# Patient Record
Sex: Male | Born: 1965 | Race: White | Hispanic: No | Marital: Married
Health system: Southern US, Community
[De-identification: ages and names within clinical notes are randomized; demographics above are authoritative.]

## PROBLEM LIST (undated history)

## (undated) DIAGNOSIS — S069XAA Unspecified intracranial injury with loss of consciousness status unknown, initial encounter: Secondary | ICD-10-CM

## (undated) DIAGNOSIS — S069X9A Unspecified intracranial injury with loss of consciousness of unspecified duration, initial encounter: Secondary | ICD-10-CM

## (undated) HISTORY — PX: TRACHEOSTOMY: SUR1362

---

## 2015-09-10 ENCOUNTER — Emergency Department (HOSPITAL_COMMUNITY): Payer: Medicare Other

## 2015-09-10 ENCOUNTER — Inpatient Hospital Stay (HOSPITAL_COMMUNITY)
Admission: EM | Admit: 2015-09-10 | Discharge: 2015-09-16 | DRG: 207 | Disposition: A | Discharge status: Skilled Nursing Facility | Payer: Medicare Other | Attending: Internal Medicine | Admitting: Internal Medicine

## 2015-09-10 ENCOUNTER — Encounter (HOSPITAL_COMMUNITY): Payer: Self-pay | Admitting: Emergency Medicine

## 2015-09-10 DIAGNOSIS — E119 Type 2 diabetes mellitus without complications: Secondary | ICD-10-CM

## 2015-09-10 DIAGNOSIS — D649 Anemia, unspecified: Secondary | ICD-10-CM | POA: Diagnosis present

## 2015-09-10 DIAGNOSIS — E871 Hypo-osmolality and hyponatremia: Secondary | ICD-10-CM | POA: Diagnosis not present

## 2015-09-10 DIAGNOSIS — F32A Depression, unspecified: Secondary | ICD-10-CM | POA: Diagnosis present

## 2015-09-10 DIAGNOSIS — E876 Hypokalemia: Secondary | ICD-10-CM | POA: Diagnosis present

## 2015-09-10 DIAGNOSIS — E785 Hyperlipidemia, unspecified: Secondary | ICD-10-CM | POA: Diagnosis present

## 2015-09-10 DIAGNOSIS — R109 Unspecified abdominal pain: Secondary | ICD-10-CM

## 2015-09-10 DIAGNOSIS — Z9911 Dependence on respirator [ventilator] status: Secondary | ICD-10-CM

## 2015-09-10 DIAGNOSIS — T17990A Other foreign object in respiratory tract, part unspecified in causing asphyxiation, initial encounter: Secondary | ICD-10-CM | POA: Diagnosis present

## 2015-09-10 DIAGNOSIS — R402422 Glasgow coma scale score 9-12, at arrival to emergency department: Secondary | ICD-10-CM | POA: Diagnosis not present

## 2015-09-10 DIAGNOSIS — Y95 Nosocomial condition: Secondary | ICD-10-CM | POA: Diagnosis not present

## 2015-09-10 DIAGNOSIS — E43 Unspecified severe protein-calorie malnutrition: Secondary | ICD-10-CM | POA: Diagnosis present

## 2015-09-10 DIAGNOSIS — R0902 Hypoxemia: Secondary | ICD-10-CM

## 2015-09-10 DIAGNOSIS — R0689 Other abnormalities of breathing: Secondary | ICD-10-CM

## 2015-09-10 DIAGNOSIS — Z931 Gastrostomy status: Secondary | ICD-10-CM

## 2015-09-10 DIAGNOSIS — R64 Cachexia: Secondary | ICD-10-CM | POA: Diagnosis not present

## 2015-09-10 DIAGNOSIS — G40909 Epilepsy, unspecified, not intractable, without status epilepticus: Secondary | ICD-10-CM | POA: Diagnosis not present

## 2015-09-10 DIAGNOSIS — D473 Essential (hemorrhagic) thrombocythemia: Secondary | ICD-10-CM | POA: Diagnosis present

## 2015-09-10 DIAGNOSIS — F329 Major depressive disorder, single episode, unspecified: Secondary | ICD-10-CM | POA: Diagnosis not present

## 2015-09-10 DIAGNOSIS — J9621 Acute and chronic respiratory failure with hypoxia: Secondary | ICD-10-CM | POA: Diagnosis present

## 2015-09-10 DIAGNOSIS — D7589 Other specified diseases of blood and blood-forming organs: Secondary | ICD-10-CM | POA: Diagnosis not present

## 2015-09-10 DIAGNOSIS — Z87820 Personal history of traumatic brain injury: Secondary | ICD-10-CM

## 2015-09-10 DIAGNOSIS — Z23 Encounter for immunization: Secondary | ICD-10-CM

## 2015-09-10 DIAGNOSIS — K9423 Gastrostomy malfunction: Secondary | ICD-10-CM

## 2015-09-10 DIAGNOSIS — Z794 Long term (current) use of insulin: Secondary | ICD-10-CM

## 2015-09-10 DIAGNOSIS — E1165 Type 2 diabetes mellitus with hyperglycemia: Secondary | ICD-10-CM | POA: Diagnosis present

## 2015-09-10 DIAGNOSIS — G894 Chronic pain syndrome: Secondary | ICD-10-CM | POA: Diagnosis present

## 2015-09-10 DIAGNOSIS — L89153 Pressure ulcer of sacral region, stage 3: Secondary | ICD-10-CM | POA: Diagnosis present

## 2015-09-10 DIAGNOSIS — Z79899 Other long term (current) drug therapy: Secondary | ICD-10-CM | POA: Diagnosis not present

## 2015-09-10 DIAGNOSIS — S069XAA Unspecified intracranial injury with loss of consciousness status unknown, initial encounter: Secondary | ICD-10-CM | POA: Diagnosis present

## 2015-09-10 DIAGNOSIS — I10 Essential (primary) hypertension: Secondary | ICD-10-CM | POA: Diagnosis not present

## 2015-09-10 DIAGNOSIS — Y838 Other surgical procedures as the cause of abnormal reaction of the patient, or of later complication, without mention of misadventure at the time of the procedure: Secondary | ICD-10-CM | POA: Diagnosis not present

## 2015-09-10 DIAGNOSIS — S069X9D Unspecified intracranial injury with loss of consciousness of unspecified duration, subsequent encounter: Secondary | ICD-10-CM

## 2015-09-10 DIAGNOSIS — J189 Pneumonia, unspecified organism: Secondary | ICD-10-CM | POA: Diagnosis present

## 2015-09-10 DIAGNOSIS — D75839 Thrombocytosis, unspecified: Secondary | ICD-10-CM | POA: Diagnosis present

## 2015-09-10 DIAGNOSIS — Z681 Body mass index (BMI) 19 or less, adult: Secondary | ICD-10-CM | POA: Diagnosis not present

## 2015-09-10 DIAGNOSIS — J151 Pneumonia due to Pseudomonas: Principal | ICD-10-CM | POA: Diagnosis present

## 2015-09-10 DIAGNOSIS — Z930 Tracheostomy status: Secondary | ICD-10-CM | POA: Diagnosis not present

## 2015-09-10 DIAGNOSIS — R0603 Acute respiratory distress: Secondary | ICD-10-CM

## 2015-09-10 DIAGNOSIS — S069X9A Unspecified intracranial injury with loss of consciousness of unspecified duration, initial encounter: Secondary | ICD-10-CM | POA: Diagnosis present

## 2015-09-10 DIAGNOSIS — D72829 Elevated white blood cell count, unspecified: Secondary | ICD-10-CM

## 2015-09-10 DIAGNOSIS — J9620 Acute and chronic respiratory failure, unspecified whether with hypoxia or hypercapnia: Secondary | ICD-10-CM | POA: Diagnosis present

## 2015-09-10 HISTORY — DX: Unspecified intracranial injury with loss of consciousness of unspecified duration, initial encounter: S06.9X9A

## 2015-09-10 HISTORY — DX: Unspecified intracranial injury with loss of consciousness status unknown, initial encounter: S06.9XAA

## 2015-09-10 LAB — I-STAT CG4 LACTIC ACID, ED
LACTIC ACID, VENOUS: 2.13 mmol/L — AB (ref 0.5–2.0)
LACTIC ACID, VENOUS: 0.72 mmol/L (ref 0.5–2.0)

## 2015-09-10 LAB — CBC WITH DIFFERENTIAL/PLATELET
Basophils Absolute: 0.1 10*3/uL (ref 0.0–0.1)
Basophils Relative: 0 %
EOS PCT: 2 %
Eosinophils Absolute: 0.4 10*3/uL (ref 0.0–0.7)
HCT: 27.4 % — ABNORMAL LOW (ref 39.0–52.0)
Hemoglobin: 8.4 g/dL — ABNORMAL LOW (ref 13.0–17.0)
LYMPHS ABS: 1.4 10*3/uL (ref 0.7–4.0)
Lymphocytes Relative: 6 %
MCH: 27.8 pg (ref 26.0–34.0)
MCHC: 30.7 g/dL (ref 30.0–36.0)
MCV: 90.7 fL (ref 78.0–100.0)
MONO ABS: 2 10*3/uL — AB (ref 0.1–1.0)
MONOS PCT: 8 %
Neutro Abs: 19.6 10*3/uL — ABNORMAL HIGH (ref 1.7–7.7)
Neutrophils Relative %: 84 %
PLATELETS: 843 10*3/uL — AB (ref 150–400)
RBC: 3.02 MIL/uL — ABNORMAL LOW (ref 4.22–5.81)
RDW: 14.5 % (ref 11.5–15.5)
WBC: 23.3 10*3/uL — ABNORMAL HIGH (ref 4.0–10.5)

## 2015-09-10 LAB — URINALYSIS, ROUTINE W REFLEX MICROSCOPIC
Bilirubin Urine: NEGATIVE
Glucose, UA: NEGATIVE mg/dL
KETONES UR: NEGATIVE mg/dL
NITRITE: NEGATIVE
PH: 5.5 (ref 5.0–8.0)
Protein, ur: 30 mg/dL — AB
Specific Gravity, Urine: 1.02 (ref 1.005–1.030)

## 2015-09-10 LAB — URINE MICROSCOPIC-ADD ON

## 2015-09-10 LAB — COMPREHENSIVE METABOLIC PANEL
ALBUMIN: 2.9 g/dL — AB (ref 3.5–5.0)
ALT: 49 U/L (ref 17–63)
AST: 25 U/L (ref 15–41)
Alkaline Phosphatase: 116 U/L (ref 38–126)
Anion gap: 10 (ref 5–15)
BILIRUBIN TOTAL: 0.2 mg/dL — AB (ref 0.3–1.2)
BUN: 8 mg/dL (ref 6–20)
CHLORIDE: 85 mmol/L — AB (ref 101–111)
CO2: 32 mmol/L (ref 22–32)
Calcium: 8.9 mg/dL (ref 8.9–10.3)
Creatinine, Ser: 0.33 mg/dL — ABNORMAL LOW (ref 0.61–1.24)
GFR calc Af Amer: >60 mL/min (ref >60)
GFR calc non Af Amer: >60 mL/min (ref >60)
GLUCOSE: 161 mg/dL — AB (ref 65–99)
POTASSIUM: 3.7 mmol/L (ref 3.5–5.1)
Sodium: 127 mmol/L — ABNORMAL LOW (ref 135–145)
Total Protein: 8.3 g/dL — ABNORMAL HIGH (ref 6.5–8.1)

## 2015-09-10 LAB — I-STAT ARTERIAL BLOOD GAS, ED
ACID-BASE EXCESS: 11 mmol/L — AB (ref 0.0–2.0)
BICARBONATE: 38.6 meq/L — AB (ref 20.0–24.0)
O2 Saturation: 100 %
TCO2: 41 mmol/L (ref 0–100)
pCO2 arterial: 70.8 mmHg (ref 35.0–45.0)
pH, Arterial: 7.345 — ABNORMAL LOW (ref 7.350–7.450)
pO2, Arterial: 467 mmHg — ABNORMAL HIGH (ref 80.0–100.0)

## 2015-09-10 LAB — I-STAT TROPONIN, ED: Troponin i, poc: 0 ng/mL (ref 0.00–0.08)

## 2015-09-10 MED ORDER — SERTRALINE HCL 50 MG PO TABS
150.0000 mg | ORAL_TABLET | Freq: Every day | ORAL | Status: DC
  Administered 2015-09-11 – 2015-09-16 (×6): 150 mg
  Filled 2015-09-10 (×2): qty 1
  Filled 2015-09-10: qty 3
  Filled 2015-09-10 (×4): qty 1

## 2015-09-10 MED ORDER — ACETAMINOPHEN 325 MG PO TABS
650.0000 mg | ORAL_TABLET | Freq: Four times a day (QID) | ORAL | Status: DC | PRN
  Administered 2015-09-12 – 2015-09-14 (×2): 650 mg
  Filled 2015-09-10 (×2): qty 2

## 2015-09-10 MED ORDER — ONDANSETRON HCL 4 MG PO TABS
4.0000 mg | ORAL_TABLET | Freq: Four times a day (QID) | ORAL | Status: DC | PRN
Start: 2015-09-10 — End: 2015-09-16

## 2015-09-10 MED ORDER — DIPHENHYDRAMINE HCL 12.5 MG/5ML PO LIQD
25.0000 mg | Freq: Four times a day (QID) | ORAL | Status: DC | PRN
  Administered 2015-09-13: 25 mg
  Filled 2015-09-10: qty 10

## 2015-09-10 MED ORDER — MORPHINE SULFATE (PF) 4 MG/ML IV SOLN
4.0000 mg | INTRAVENOUS | Status: AC | PRN
  Administered 2015-09-10 – 2015-09-11 (×4): 4 mg via INTRAVENOUS
  Filled 2015-09-10 (×4): qty 1

## 2015-09-10 MED ORDER — VITAL 1.5 CAL PO LIQD
42.0000 mL/h | ORAL | Status: DC
  Administered 2015-09-11 (×2): 42 mL/h
  Filled 2015-09-10 (×2): qty 1000

## 2015-09-10 MED ORDER — ENOXAPARIN SODIUM 40 MG/0.4ML ~~LOC~~ SOLN
40.0000 mg | SUBCUTANEOUS | Status: DC
  Administered 2015-09-11: 40 mg via SUBCUTANEOUS
  Filled 2015-09-10: qty 0.4

## 2015-09-10 MED ORDER — DIVALPROEX SODIUM 500 MG PO DR TAB
500.0000 mg | DELAYED_RELEASE_TABLET | Freq: Every day | ORAL | Status: DC
  Administered 2015-09-10 – 2015-09-15 (×6): 500 mg via ORAL
  Filled 2015-09-10: qty 2
  Filled 2015-09-10 (×5): qty 1

## 2015-09-10 MED ORDER — PIPERACILLIN-TAZOBACTAM 3.375 G IVPB 30 MIN
3.3750 g | Freq: Once | INTRAVENOUS | Status: AC
Start: 2015-09-10 — End: 2015-09-10
  Administered 2015-09-10: 3.375 g via INTRAVENOUS
  Filled 2015-09-10: qty 50

## 2015-09-10 MED ORDER — ALBUTEROL SULFATE (2.5 MG/3ML) 0.083% IN NEBU: 2.5000 mg | INHALATION_SOLUTION | RESPIRATORY_TRACT | Status: DC | PRN

## 2015-09-10 MED ORDER — DIVALPROEX SODIUM 250 MG PO DR TAB
250.0000 mg | DELAYED_RELEASE_TABLET | Freq: Two times a day (BID) | ORAL | Status: DC
  Administered 2015-09-11 – 2015-09-16 (×11): 250 mg via ORAL
  Filled 2015-09-10 (×13): qty 1

## 2015-09-10 MED ORDER — SENNOSIDES-DOCUSATE SODIUM 8.6-50 MG PO TABS
1.0000 | ORAL_TABLET | Freq: Two times a day (BID) | ORAL | Status: DC
  Administered 2015-09-10 – 2015-09-16 (×12): 1
  Filled 2015-09-10 (×13): qty 1

## 2015-09-10 MED ORDER — IPRATROPIUM-ALBUTEROL 0.5-2.5 (3) MG/3ML IN SOLN
3.0000 mL | RESPIRATORY_TRACT | Status: DC | PRN
  Filled 2015-09-10: qty 3

## 2015-09-10 MED ORDER — SODIUM CHLORIDE 0.9 % IV BOLUS (SEPSIS)
1000.0000 mL | Freq: Once | INTRAVENOUS | Status: AC
  Administered 2015-09-10: 1000 mL via INTRAVENOUS

## 2015-09-10 MED ORDER — BACITRACIN ZINC 500 UNIT/GM EX OINT
1.0000 "application " | TOPICAL_OINTMENT | Freq: Two times a day (BID) | CUTANEOUS | Status: DC
  Administered 2015-09-11 – 2015-09-13 (×6): 1 via TOPICAL
  Filled 2015-09-10: qty 28.35

## 2015-09-10 MED ORDER — MAGNESIUM OXIDE 400 (241.3 MG) MG PO TABS: 400.0000 mg | ORAL_TABLET | Freq: Two times a day (BID) | ORAL | Status: DC | PRN

## 2015-09-10 MED ORDER — CLOTRIMAZOLE 1 % EX CREA
TOPICAL_CREAM | Freq: Two times a day (BID) | CUTANEOUS | Status: DC
  Administered 2015-09-11 – 2015-09-13 (×6): via TOPICAL
  Filled 2015-09-10: qty 15

## 2015-09-10 MED ORDER — IPRATROPIUM-ALBUTEROL 0.5-2.5 (3) MG/3ML IN SOLN
3.0000 mL | Freq: Four times a day (QID) | RESPIRATORY_TRACT | Status: DC
  Administered 2015-09-10 – 2015-09-14 (×14): 3 mL via RESPIRATORY_TRACT
  Filled 2015-09-10 (×14): qty 3

## 2015-09-10 MED ORDER — FAMOTIDINE 20 MG PO TABS
20.0000 mg | ORAL_TABLET | Freq: Two times a day (BID) | ORAL | Status: DC
  Administered 2015-09-10 – 2015-09-16 (×12): 20 mg via ORAL
  Filled 2015-09-10 (×12): qty 1

## 2015-09-10 MED ORDER — VANCOMYCIN HCL IN DEXTROSE 750-5 MG/150ML-% IV SOLN
750.0000 mg | INTRAVENOUS | Status: DC
  Administered 2015-09-11: 750 mg via INTRAVENOUS
  Filled 2015-09-10 (×2): qty 150

## 2015-09-10 MED ORDER — POLYETHYLENE GLYCOL 3350 17 G PO PACK: 17.0000 g | PACK | Freq: Every day | ORAL | Status: DC | PRN

## 2015-09-10 MED ORDER — SODIUM CHLORIDE 0.9 % IV BOLUS (SEPSIS)
1000.0000 mL | Freq: Once | INTRAVENOUS | Status: AC
Start: 2015-09-10 — End: 2015-09-10
  Administered 2015-09-10: 1000 mL via INTRAVENOUS

## 2015-09-10 MED ORDER — ONDANSETRON HCL 4 MG/2ML IJ SOLN
4.0000 mg | Freq: Once | INTRAMUSCULAR | Status: AC
  Administered 2015-09-10: 4 mg via INTRAVENOUS
  Filled 2015-09-10: qty 2

## 2015-09-10 MED ORDER — DIAZEPAM 5 MG PO TABS
10.0000 mg | ORAL_TABLET | Freq: Two times a day (BID) | ORAL | Status: DC
  Administered 2015-09-10 – 2015-09-16 (×12): 10 mg
  Filled 2015-09-10 (×12): qty 2

## 2015-09-10 MED ORDER — INSULIN ASPART 100 UNIT/ML ~~LOC~~ SOLN
0.0000 [IU] | Freq: Three times a day (TID) | SUBCUTANEOUS | Status: DC
Start: 2015-09-11 — End: 2015-09-11
  Administered 2015-09-11: 2 [IU] via SUBCUTANEOUS

## 2015-09-10 MED ORDER — THIAMINE HCL 100 MG PO TABS
200.0000 mg | ORAL_TABLET | Freq: Every day | ORAL | Status: DC
  Administered 2015-09-11 – 2015-09-14 (×4): 200 mg
  Filled 2015-09-10 (×3): qty 2

## 2015-09-10 MED ORDER — VANCOMYCIN HCL IN DEXTROSE 1-5 GM/200ML-% IV SOLN
1000.0000 mg | Freq: Once | INTRAVENOUS | Status: AC
  Administered 2015-09-10: 1000 mg via INTRAVENOUS
  Filled 2015-09-10: qty 200

## 2015-09-10 MED ORDER — PIPERACILLIN-TAZOBACTAM 3.375 G IVPB
3.3750 g | Freq: Three times a day (TID) | INTRAVENOUS | Status: DC
  Administered 2015-09-11 – 2015-09-14 (×11): 3.375 g via INTRAVENOUS
  Filled 2015-09-10 (×13): qty 50

## 2015-09-10 MED ORDER — QUETIAPINE FUMARATE 50 MG PO TABS
50.0000 mg | ORAL_TABLET | Freq: Three times a day (TID) | ORAL | Status: DC
  Administered 2015-09-11 – 2015-09-16 (×16): 50 mg via ORAL
  Filled 2015-09-10 (×16): qty 1

## 2015-09-10 MED ORDER — POTASSIUM CHLORIDE IN NACL 20-0.9 MEQ/L-% IV SOLN
INTRAVENOUS | Status: DC
  Administered 2015-09-10 – 2015-09-11 (×2): via INTRAVENOUS
  Filled 2015-09-10 (×3): qty 1000

## 2015-09-10 MED ORDER — CHLORHEXIDINE GLUCONATE 0.12 % MT SOLN
15.0000 mL | Freq: Four times a day (QID) | OROMUCOSAL | Status: DC
  Administered 2015-09-11 – 2015-09-16 (×20): 15 mL via OROMUCOSAL
  Filled 2015-09-10 (×8): qty 15

## 2015-09-10 MED ORDER — ONDANSETRON HCL 4 MG/2ML IJ SOLN
4.0000 mg | Freq: Four times a day (QID) | INTRAMUSCULAR | Status: DC | PRN
  Administered 2015-09-12: 4 mg via INTRAVENOUS
  Filled 2015-09-10: qty 2

## 2015-09-10 MED ORDER — FOLIC ACID 1 MG PO TABS
1.0000 mg | ORAL_TABLET | Freq: Every day | ORAL | Status: DC
  Administered 2015-09-11 – 2015-09-16 (×6): 1 mg via ORAL
  Filled 2015-09-10 (×6): qty 1

## 2015-09-10 MED ORDER — METOPROLOL TARTRATE 25 MG PO TABS
25.0000 mg | ORAL_TABLET | Freq: Two times a day (BID) | ORAL | Status: DC
  Administered 2015-09-10 – 2015-09-16 (×12): 25 mg via ORAL
  Filled 2015-09-10 (×12): qty 1

## 2015-09-10 MED ORDER — MORPHINE SULFATE 15 MG PO TABS
15.0000 mg | ORAL_TABLET | Freq: Four times a day (QID) | ORAL | Status: DC | PRN
  Administered 2015-09-11 – 2015-09-12 (×3): 15 mg via ORAL
  Filled 2015-09-10 (×4): qty 1

## 2015-09-10 NOTE — Progress Notes (Signed)
Pharmacy Antibiotic Note  Dean Jones is a 50 y.o. male admitted on 09/10/2015 with tachypnea, tachycardia, hypertensive during transport from [facility] to another facility. Pt has hx of TBI, is bedbound and has a chronic trach. LA 2.1, SCr 0.33, LFTs/Tbili wnl, WBC 23.3. Antibiotics are being administered empirically for sepsis.  Plan: -Vancomycin 1 g IV x1 then 750 mg IV q24h -Zosyn 3.375 g IV q8h -Monitor renal fx, cultures, VT at Css and duration of therapy   Height: 5\' 7"  (170.2 cm) Weight: 112 lb (50.803 kg) IBW/kg (Calculated) : 66.1  Temp (24hrs), Avg:98.7 F (37.1 C), Min:98.7 F (37.1 C), Max:98.7 F (37.1 C)   Recent Labs Lab 09/10/15 1725 09/10/15 1920  WBC 23.3*  --   CREATININE 0.33*  --   LATICACIDVEN  --  2.13*    Estimated Creatinine Clearance: 77.6 mL/min (by C-G formula based on Cr of 0.33).    Allergies  Allergen Reactions  . Codeine Shortness Of Breath, Swelling and Rash  . Haloperidol Other (See Comments)    MENTAL STATUS CHANGES/AGITATION    Antimicrobials this admission: 5/31 vancomycin >> 5/31 zosyn >>  Dose adjustments this admission: NA  Microbiology results: 5/31 blood cx:  Thank you for allowing pharmacy to be a part of this patient's care.  Harvel Quale 09/10/2015 7:25 PM

## 2015-09-10 NOTE — Progress Notes (Signed)
Tracheal Aspirate obtained/labelled/sent to lab. 
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
## 2015-09-10 NOTE — ED Notes (Signed)
Dean Jones - spouse [phone] telephone contact no.

## 2015-09-10 NOTE — ED Provider Notes (Signed)
CSN: [id number]     Arrival date & time 09/10/15  1654 History   First MD Initiated Contact with Patient 09/10/15 1659     Chief Complaint  Patient presents with  . Respiratory Distress     (Consider location/radiation/quality/duration/timing/severity/associated sxs/prior Treatment) Patient is a 50 y.o. male presenting with general illness. The history is provided by the EMS personnel.  Illness Location:  Respiratory distress Severity:  Severe Onset quality:  Sudden Duration:  1 hour Timing:  Constant Progression:  Unchanged Chronicity:  New Context:  History of TBI, trach, PEG. Patient was being [facility]. In route the patient became diaphoretic, tachypneic, tachycardic, hypertensive, hypercapnic, hypoxic. Patient unable to communicate. Associated symptoms: shortness of breath     Past Medical History  Diagnosis Date  . TBI (traumatic brain injury) [facility])    Past Surgical History  Procedure Laterality Date  . Tracheostomy     No family history on file. Social History  Substance Use Topics  . Smoking status: Not on file  . Smokeless tobacco: Not on file  . Alcohol Use: Not on file    Review of Systems  Unable to perform ROS: Intubated  Respiratory: Positive for shortness of breath.       Allergies  Codeine and Haloperidol  Home Medications   Prior to Admission medications   Medication Sig Start Date End Date Taking? Authorizing Provider  acetaminophen (TYLENOL) 650 MG CR tablet 650 mg by Feeding Tube route every 6 (six) hours as needed for pain.   Yes Historical Provider, MD  atorvastatin (LIPITOR) 20 MG tablet 20 mg by Feeding route at bedtime.   Yes Historical Provider, MD  bacitracin ointment Apply 1 application topically 2 (two) times daily.   Yes Historical Provider, MD  CARBOXYMETHYLCELLULOSE SODIUM OP Place 2 drops into both eyes 2 (two) times daily.   Yes Historical Provider, MD  chlorhexidine (PERIDEX) 0.12 %  solution Use as directed 15 mLs in the mouth or throat every 6 (six) hours.   Yes Historical Provider, MD  clotrimazole-betamethasone (LOTRISONE) cream Apply 1 application topically every 8 (eight) hours.   Yes Historical Provider, MD  diazepam (VALIUM) 10 MG tablet 10 mg by Feeding Tube route every 12 (twelve) hours.   Yes Historical Provider, MD  diphenhydrAMINE (BENADRYL) 12.5 MG/5ML liquid 25 mg by Feeding Tube route every 6 (six) hours as needed for itching or allergies.   Yes Historical Provider, MD  divalproex (DEPAKOTE) 250 MG DR tablet 250 mg by Feeding Tube route 2 (two) times daily. 0900 and 1800   Yes Historical Provider, MD  divalproex (DEPAKOTE) 500 MG DR tablet 500 mg by Feeding Tube route at bedtime.   Yes Historical Provider, MD  enoxaparin (LOVENOX) 40 MG/0.4ML injection Inject 40 mg into the skin every morning.   Yes Historical Provider, MD  famotidine (PEPCID) 20 MG tablet 20 mg by Feeding route 2 (two) times daily.   Yes Historical Provider, MD  folic acid (FOLVITE) 1 MG tablet 1 mg by Feeding Tube route every morning.   Yes Historical Provider, MD  insulin regular (NOVOLIN R,HUMULIN R) 100 units/mL injection Inject 1-5 Units into the skin every 6 (six) hours. Per sliding scale: BGL 150 or less = 0 units; 151-200 = 1 unit; 201-250 = 2 units; 251-300 = 3 units; 301-350 = 4 units; 351-400 = 5 units; >401 = call the doctor   Yes Historical Provider, MD  ipratropium-albuterol (DUONEB) 0.5-2.5 (3) MG/3ML SOLN Take 3  mLs by nebulization every 6 (six) hours.   Yes Historical Provider, MD  LACTOBACILLUS PO 2 capsules by Feeding Tube route every morning.   Yes Historical Provider, MD  magnesium oxide (MAG-OX) 400 MG tablet 400 mg by Feeding Tube route every 12 (twelve) hours as needed.    Yes Historical Provider, MD  metoprolol tartrate (LOPRESSOR) 25 MG tablet 25 mg by Feeding Tube route 2 (two) times daily.   Yes Historical Provider, MD  morphine (MSIR) 15 MG tablet 15 mg by Feeding Tube  route every 6 (six) hours.   Yes Historical Provider, MD  Nutritional Supplements (FEEDING SUPPLEMENT, VITAL 1.5 CAL,) LIQD Place into feeding tube continuous. 70 ml's/hour (rate)   Yes Historical Provider, MD  ondansetron (ZOFRAN) 4 MG tablet 4 mg by Feeding Tube route every 6 (six) hours as needed for nausea or vomiting.   Yes Historical Provider, MD  polyethylene glycol (MIRALAX / GLYCOLAX) packet 17 g by Feeding Tube route 2 (two) times daily as needed for mild constipation.   Yes Historical Provider, MD  QUEtiapine (SEROQUEL) 50 MG tablet 50 mg by Feeding Tube route every 8 (eight) hours.   Yes Historical Provider, MD  sennosides-docusate sodium (SENOKOT-S) 8.6-50 MG tablet 1 tablet by Feeding Tube route 2 (two) times daily.   Yes Historical Provider, MD  sertraline (ZOLOFT) 100 MG tablet 150 mg by Feeding Tube route every morning.   Yes Historical Provider, MD  thiamine 100 MG tablet 200 mg by Feeding Tube route every morning.    Yes Historical Provider, MD  triamcinolone (KENALOG) 0.025 % cream Apply 1 application topically 2 (two) times daily.   Yes Historical Provider, MD  UNABLE TO FIND BIOACTIVE ENZYMES: 2 sprays to mouth every four hours as needed   Yes Historical Provider, MD   BP 115/72 mmHg  Pulse 88  Temp(Src) 98.7 F (37.1 C) (Rectal)  Resp 19  Ht 5\' 7"  (1.702 m)  Wt 50.803 kg  BMI 17.54 kg/m2  SpO2 100% Physical Exam  Constitutional: He appears ill. He appears distressed.  Cachectic. Trach with ventilator in place.  HENT:  Head: Normocephalic and atraumatic.  Eyes: EOM are normal. Pupils are equal, round, and reactive to light.  Cardiovascular: Regular rhythm and intact distal pulses.  Tachycardia present.   Pulmonary/Chest: Accessory muscle usage present. Tachypnea noted. He is in respiratory distress. He has wheezes. He has rhonchi.  Abdominal: Soft. There is no tenderness. There is no rebound and no guarding.  PEG in place.  Musculoskeletal: He exhibits no edema.   Neurological: GCS eye subscore is 4. GCS verbal subscore is 1. GCS motor subscore is 5.  GCS 10T.  Skin: Skin is warm and dry.    ED Course  Procedures (including critical care time) Labs Review Labs Reviewed  COMPREHENSIVE METABOLIC PANEL - Abnormal; Notable for the following:    Sodium 127 (*)    Chloride 85 (*)    Glucose, Bld 161 (*)    Creatinine, Ser 0.33 (*)    Total Protein 8.3 (*)    Albumin 2.9 (*)    Total Bilirubin 0.2 (*)    All other components within normal limits  CBC WITH DIFFERENTIAL/PLATELET - Abnormal; Notable for the following:    WBC 23.3 (*)    RBC 3.02 (*)    Hemoglobin 8.4 (*)    HCT 27.4 (*)    Platelets 843 (*)    Neutro Abs 19.6 (*)    Monocytes Absolute 2.0 (*)    All  other components within normal limits  I-STAT ARTERIAL BLOOD GAS, ED - Abnormal; Notable for the following:    pH, Arterial 7.345 (*)    pCO2 arterial 70.8 (*)    pO2, Arterial 467.0 (*)    Bicarbonate 38.6 (*)    Acid-Base Excess 11.0 (*)    All other components within normal limits  I-STAT CG4 LACTIC ACID, ED - Abnormal; Notable for the following:    Lactic Acid, Venous 2.13 (*)    All other components within normal limits  CULTURE, BLOOD (ROUTINE X 2)  CULTURE, BLOOD (ROUTINE X 2)  URINALYSIS, ROUTINE W REFLEX MICROSCOPIC (NOT AT [facility])  Randolm Idol, ED    Imaging Review Dg Chest Port 1 View  09/10/2015  CLINICAL DATA:  History of traumatic brain injury. Low oxygen saturations. Tracheostomy tube. EXAM: PORTABLE CHEST 1 VIEW COMPARISON:  None. FINDINGS: There is a tracheostomy tube and the tip is well above the carina. Opacities in both upper lungs could represent a combination of pleural and parenchymal disease. Disease is more confluent or dense on the right side. Lung bases are clear. Evidence for gas in the stomach. Heart size is within normal limits. IMPRESSION: Prominent densities involving the apices and upper lungs bilaterally, right side greater than left. This  probably represents a combination of pleural and parenchymal disease and may be chronic. Neoplastic process cannot be excluded but this could be better characterized with comparison images if available. Tracheostomy tube. Electronically Signed   By: Markus Daft M.D.   On: 09/10/2015 18:22   I have personally reviewed and evaluated these images and lab results as part of my medical decision-making.   EKG Interpretation   Date/Time:  Wednesday Sep 10 2015 17:11:30 EDT Ventricular Rate:  116 PR Interval:  188 QRS Duration: 104 QT Interval:  315 QTC Calculation: 437 R Axis:   106 Text Interpretation:  Sinus tachycardia Right atrial enlargement Left  posterior fascicular block Consider left ventricular hypertrophy ST elev,  probable normal early repol pattern Baseline wander in lead(s) II III aVF  V3 V4 V5 V6 No prior EKG for comparison , wandering baseline  Confirmed by  LIU MD, Hinton Dyer [id number]) on 09/10/2015 5:21:58 PM      MDM   Final diagnoses:  Respiratory distress  Hypoxia  Hypercapnia  Leukocytosis  Tracheostomy dependence (HCC)  TBI (traumatic brain injury), with loss of consciousness of unspecified duration, subsequent encounter [facility])    50 year old male with a history of a TBI, trach dependent, PEG dependent, presenting in respiratory distress. Patient was ill-appearing and in obvious distress when he first arrived. Tachypneic, tachycardic, hypertensive, diaphoretic, using accessory muscles for respirations. Diffusely wheezing with rhonchorous breath sounds. He was hypoxic into the 70s. Improved after increasing PEEP and his FiO2. Hypercarbic into the 60s. Increased his respiratory rate. After changing his ventilator settings the patient looked markedly improved and more comfortable.  ABG shows some marketed hyper cardia. PH is well compensated. Bicarbonate is significantly elevated to nearly 40. Possible that this is nearly the patient's baseline. Increased his respiratory rate a  little more. EKG shows no ischemic abnormalities or interval changes. Chest x-ray shows evidence of an opacity in the right apex. We have no prior chest x-ray to compare to. Leukocytosis to nearly 24. Tachycardic into the low 100s. Concern for possible sepsis. Code sepsis called. Given 30 mL's point per kilo IV fluid bolus. Started on broad-spectrum antibiotics. Blood cultures sent. Lactate stent. Mild hyponatremia to 127. We will fluid restrict.  Given  concern for respiratory distress in the setting of a ventilated trach patient with potential pulmonary infection, will admit to the hospitalist stepdown unit for further intervention and observation.  Maryan Puls, MD 09/10/15 [id number]  Forde Dandy, MD 09/11/15 [phone]

## 2015-09-11 ENCOUNTER — Encounter (HOSPITAL_COMMUNITY): Payer: Self-pay | Admitting: Radiology

## 2015-09-11 ENCOUNTER — Inpatient Hospital Stay (HOSPITAL_COMMUNITY): Payer: Medicare Other

## 2015-09-11 DIAGNOSIS — E43 Unspecified severe protein-calorie malnutrition: Secondary | ICD-10-CM | POA: Insufficient documentation

## 2015-09-11 DIAGNOSIS — L89153 Pressure ulcer of sacral region, stage 3: Secondary | ICD-10-CM

## 2015-09-11 DIAGNOSIS — E119 Type 2 diabetes mellitus without complications: Secondary | ICD-10-CM

## 2015-09-11 DIAGNOSIS — D649 Anemia, unspecified: Secondary | ICD-10-CM

## 2015-09-11 DIAGNOSIS — J9621 Acute and chronic respiratory failure with hypoxia: Secondary | ICD-10-CM

## 2015-09-11 DIAGNOSIS — G40909 Epilepsy, unspecified, not intractable, without status epilepticus: Secondary | ICD-10-CM

## 2015-09-11 DIAGNOSIS — J189 Pneumonia, unspecified organism: Secondary | ICD-10-CM

## 2015-09-11 LAB — GLUCOSE, CAPILLARY
Glucose-Capillary: 131 mg/dL — ABNORMAL HIGH (ref 65–99)
Glucose-Capillary: 90 mg/dL (ref 65–99)
Glucose-Capillary: 114 mg/dL — ABNORMAL HIGH (ref 65–99)
Glucose-Capillary: 169 mg/dL — ABNORMAL HIGH (ref 65–99)

## 2015-09-11 LAB — VALPROIC ACID LEVEL: Valproic Acid Lvl: 18 ug/mL — ABNORMAL LOW (ref 50.0–100.0)

## 2015-09-11 LAB — CBC WITH DIFFERENTIAL/PLATELET
BASOS ABS: 0 10*3/uL (ref 0.0–0.1)
Basophils Relative: 0 %
EOS ABS: 0.1 10*3/uL (ref 0.0–0.7)
Eosinophils Relative: 1 %
HCT: 23.2 % — ABNORMAL LOW (ref 39.0–52.0)
Hemoglobin: 7.1 g/dL — ABNORMAL LOW (ref 13.0–17.0)
LYMPHS ABS: 0.8 10*3/uL (ref 0.7–4.0)
Lymphocytes Relative: 6 %
MCH: 28 pg (ref 26.0–34.0)
MCHC: 30.6 g/dL (ref 30.0–36.0)
MCV: 91.3 fL (ref 78.0–100.0)
MONO ABS: 0.9 10*3/uL (ref 0.1–1.0)
MONOS PCT: 7 %
Neutro Abs: 10.9 10*3/uL — ABNORMAL HIGH (ref 1.7–7.7)
Neutrophils Relative %: 86 %
PLATELETS: 493 10*3/uL — AB (ref 150–400)
RBC: 2.54 MIL/uL — AB (ref 4.22–5.81)
RDW: 14.6 % (ref 11.5–15.5)
WBC: 12.7 10*3/uL — AB (ref 4.0–10.5)

## 2015-09-11 LAB — COMPREHENSIVE METABOLIC PANEL
ALBUMIN: 2.5 g/dL — AB (ref 3.5–5.0)
ALT: 34 U/L (ref 17–63)
AST: 16 U/L (ref 15–41)
Alkaline Phosphatase: 92 U/L (ref 38–126)
Anion gap: 10 (ref 5–15)
BUN: 6 mg/dL (ref 6–20)
CHLORIDE: 95 mmol/L — AB (ref 101–111)
CO2: 27 mmol/L (ref 22–32)
Calcium: 8.8 mg/dL — ABNORMAL LOW (ref 8.9–10.3)
GLUCOSE: 116 mg/dL — AB (ref 65–99)
Potassium: 3.4 mmol/L — ABNORMAL LOW (ref 3.5–5.1)
SODIUM: 132 mmol/L — AB (ref 135–145)
Total Bilirubin: 0.3 mg/dL (ref 0.3–1.2)
Total Protein: 6.4 g/dL — ABNORMAL LOW (ref 6.5–8.1)

## 2015-09-11 LAB — STREP PNEUMONIAE URINARY ANTIGEN: Strep Pneumo Urinary Antigen: NEGATIVE

## 2015-09-11 LAB — IRON AND TIBC
IRON: 15 ug/dL — AB (ref 45–182)
SATURATION RATIOS: 6 % — AB (ref 17.9–39.5)
TIBC: 265 ug/dL (ref 250–450)
UIBC: 250 ug/dL

## 2015-09-11 LAB — I-STAT ARTERIAL BLOOD GAS, ED
ACID-BASE EXCESS: 5 mmol/L — AB (ref 0.0–2.0)
BICARBONATE: 29.7 meq/L — AB (ref 20.0–24.0)
O2 Saturation: 94 %
PCO2 ART: 43.9 mmHg (ref 35.0–45.0)
PH ART: 7.439 (ref 7.350–7.450)
PO2 ART: 67 mmHg — AB (ref 80.0–100.0)
Patient temperature: 98.6
TCO2: 31 mmol/L (ref 0–100)

## 2015-09-11 LAB — FERRITIN: Ferritin: 1052 ng/mL — ABNORMAL HIGH (ref 24–336)

## 2015-09-11 LAB — ABO/RH: ABO/RH(D): O POS

## 2015-09-11 LAB — OSMOLALITY, URINE: OSMOLALITY UR: 427 mosm/kg (ref 300–900)

## 2015-09-11 LAB — TSH: TSH: 1.415 u[IU]/mL (ref 0.350–4.500)

## 2015-09-11 LAB — CBG MONITORING, ED: Glucose-Capillary: 116 mg/dL — ABNORMAL HIGH (ref 65–99)

## 2015-09-11 LAB — VITAMIN B12: VITAMIN B 12: 1014 pg/mL — AB (ref 180–914)

## 2015-09-11 LAB — OCCULT BLOOD X 1 CARD TO LAB, STOOL: FECAL OCCULT BLD: NEGATIVE

## 2015-09-11 LAB — FOLATE: Folate: 52.5 ng/mL (ref >5.9)

## 2015-09-11 LAB — HEMOGLOBIN AND HEMATOCRIT, BLOOD
HCT: 20 % — ABNORMAL LOW (ref 39.0–52.0)
Hemoglobin: 6.1 g/dL — CL (ref 13.0–17.0)

## 2015-09-11 LAB — MRSA PCR SCREENING: MRSA BY PCR: NEGATIVE

## 2015-09-11 LAB — OSMOLALITY: OSMOLALITY: 270 mosm/kg — AB (ref 275–295)

## 2015-09-11 LAB — PREPARE RBC (CROSSMATCH)

## 2015-09-11 LAB — PROCALCITONIN: Procalcitonin: 2.21 ng/mL

## 2015-09-11 MED ORDER — KCL IN DEXTROSE-NACL 20-5-0.45 MEQ/L-%-% IV SOLN
INTRAVENOUS | Status: DC
  Administered 2015-09-11 – 2015-09-12 (×2): via INTRAVENOUS
  Filled 2015-09-11 (×6): qty 1000

## 2015-09-11 MED ORDER — CHLORHEXIDINE GLUCONATE 0.12% ORAL RINSE (MEDLINE KIT)
15.0000 mL | Freq: Two times a day (BID) | OROMUCOSAL | Status: DC
  Administered 2015-09-11: 15 mL via OROMUCOSAL

## 2015-09-11 MED ORDER — JEVITY 1.2 CAL PO LIQD: 1000.0000 mL | ORAL | Status: DC

## 2015-09-11 MED ORDER — PIVOT 1.5 CAL PO LIQD
1000.0000 mL | ORAL | Status: DC
  Filled 2015-09-11: qty 1000

## 2015-09-11 MED ORDER — SODIUM CHLORIDE 0.9 % IV SOLN
Freq: Once | INTRAVENOUS | Status: AC
  Administered 2015-09-11: 15:00:00 via INTRAVENOUS

## 2015-09-11 MED ORDER — POTASSIUM CHLORIDE 20 MEQ/15ML (10%) PO SOLN
20.0000 meq | Freq: Once | ORAL | Status: AC
  Administered 2015-09-11: 20 meq
  Filled 2015-09-11: qty 15

## 2015-09-11 MED ORDER — PIVOT 1.5 CAL PO LIQD
1000.0000 mL | ORAL | Status: DC
  Administered 2015-09-12 – 2015-09-16 (×6): 1000 mL
  Filled 2015-09-11 (×9): qty 1000

## 2015-09-11 MED ORDER — IOPAMIDOL (ISOVUE-300) INJECTION 61%
INTRAVENOUS | Status: AC
  Administered 2015-09-11: 80 mL
  Filled 2015-09-11: qty 100

## 2015-09-11 MED ORDER — ANTISEPTIC ORAL RINSE SOLUTION (CORINZ)
7.0000 mL | Freq: Four times a day (QID) | OROMUCOSAL | Status: DC
  Administered 2015-09-11 – 2015-09-16 (×18): 7 mL via OROMUCOSAL

## 2015-09-11 MED ORDER — INSULIN ASPART 100 UNIT/ML ~~LOC~~ SOLN
0.0000 [IU] | Freq: Four times a day (QID) | SUBCUTANEOUS | Status: DC
  Administered 2015-09-12 – 2015-09-15 (×5): 1 [IU] via SUBCUTANEOUS
  Administered 2015-09-16: 2 [IU] via SUBCUTANEOUS
  Administered 2015-09-16: 1 [IU] via SUBCUTANEOUS

## 2015-09-11 NOTE — Progress Notes (Signed)
Pt c/o abdominal pain, and tubefeeding kept on beeping tube feeding machine and tubing set was already changed and still pump kept saying clogged, MD made aware and ordered CT of the abdomen,pt was trying to get out of the bed, PRN morphine was given. Wife  was given updates and said she is coming.

## 2015-09-11 NOTE — Progress Notes (Signed)
MD was paged for critical result of hemoglobin 6.1,and ordered tranfuse 2 units of PRBC.
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
## 2015-09-11 NOTE — Progress Notes (Signed)
Pt. transported to 2C04 uneventfully from ED-36, RT to monitor.
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
## 2015-09-11 NOTE — Clinical Social Work Note (Signed)
Clinical Social Work Assessment  Patient Details  Name: Dean Jones MRN: [id number] Date of Birth: Jan 21, 1966  Date of referral:  09/11/15               Reason for consult:  Facility Placement                Permission sought to share information with:  Family Supports Permission granted to share information::  No (pt nonverbal)  Name::     Dean Jones  [facility]ency::  [facility]  Relationship::  wife  Contact Information:     Housing/Transportation Living arrangements for the past 2 months:  [place], [place], [place] of Information:  Spouse Patient Interpreter Needed:  None Criminal Activity/Legal Involvement Pertinent to Current Situation/Hospitalization:  No - Comment as needed Significant Relationships:  Spouse, Adult Children Lives with:  Facility Resident Do you feel safe going back to the place where you live?  Yes Need for family participation in patient care:  Yes (Comment) (decision making)  Care giving concerns:  None- pt was facility resident and was being transferred to [facility] in [place] for long term care   Social Worker assessment / plan: CSW spoke with pt wife about plan for pt at time of DC.  Pt was on his way to [facility] in [place] for long term vent care when brought to hospital for medical concerns.  Wife is now very anxious about losing bed at [place] spoke with Tammi at [facility] who states they can not save a bed for this patient but would do their best to admit when pt is stable for DC- they are almost full at this time so unsure what their availability will look like early next week when pt is projected to DC.  Employment status:  Disabled (Comment on whether or not currently receiving Disability) Insurance information:    PT Recommendations:  Not assessed at this time Information / Referral to community resources:  [place]  Patient/Family's Response to care:  CSW explained that bed at [facility] is not guaranteed and that a different bed might  need to be pursued- pt wife expressed understanding but is very hopeful for transfer to [facility]  Patient/Family's Understanding of and Emotional Response to Diagnosis, Current Treatment, and Prognosis:  Pt wife is very involved in care and is hopeful pt will recover soon and be able to transfer to [facility] where he will be closer to home.  Emotional Assessment Appearance:  Appears stated age, Disheveled Attitude/Demeanor/Rapport:  Unable to Assess Affect (typically observed):  Unable to Assess Orientation:    Alcohol / Substance use:  Not Applicable Psych involvement (Current and /or in the community):  No (Comment)  Discharge Needs  Concerns to be addressed:  Care Coordination Readmission within the last 30 days:  No Current discharge risk:  Physical Impairment, Chronically ill Barriers to Discharge:  Continued Medical Work up   [employer], LCSW 09/11/2015, 3:14 PM

## 2015-09-11 NOTE — ED Notes (Signed)
RT suctioned pt. , breathing easier/respirations unlabored .

## 2015-09-11 NOTE — Progress Notes (Signed)
Attempted to wean fio2 to 30%, pt desat to 87-88%.  Fio2 increased to 40%, Rn at bedside and aware.  Sat 96% on 40% fio2.

## 2015-09-12 DIAGNOSIS — E43 Unspecified severe protein-calorie malnutrition: Secondary | ICD-10-CM

## 2015-09-12 DIAGNOSIS — D473 Essential (hemorrhagic) thrombocythemia: Secondary | ICD-10-CM

## 2015-09-12 LAB — CBC WITH DIFFERENTIAL/PLATELET
BASOS ABS: 0 10*3/uL (ref 0.0–0.1)
Basophils Relative: 0 %
Eosinophils Absolute: 0.6 10*3/uL (ref 0.0–0.7)
Eosinophils Relative: 7 %
HEMATOCRIT: 24.9 % — AB (ref 39.0–52.0)
HEMOGLOBIN: 7.9 g/dL — AB (ref 13.0–17.0)
LYMPHS PCT: 9 %
Lymphs Abs: 0.8 10*3/uL (ref 0.7–4.0)
MCH: 27.7 pg (ref 26.0–34.0)
MCHC: 31.7 g/dL (ref 30.0–36.0)
MCV: 87.4 fL (ref 78.0–100.0)
Monocytes Absolute: 1.1 10*3/uL — ABNORMAL HIGH (ref 0.1–1.0)
Monocytes Relative: 12 %
NEUTROS ABS: 6.5 10*3/uL (ref 1.7–7.7)
Neutrophils Relative %: 72 %
Platelets: 515 10*3/uL — ABNORMAL HIGH (ref 150–400)
RBC: 2.85 MIL/uL — AB (ref 4.22–5.81)
RDW: 15.2 % (ref 11.5–15.5)
WBC: 8.9 10*3/uL (ref 4.0–10.5)

## 2015-09-12 LAB — TYPE AND SCREEN
ABO/RH(D): O POS
ANTIBODY SCREEN: NEGATIVE
UNIT DIVISION: 0
UNIT DIVISION: 0

## 2015-09-12 LAB — BASIC METABOLIC PANEL
ANION GAP: 10 (ref 5–15)
BUN: <5 mg/dL — ABNORMAL LOW (ref 6–20)
CHLORIDE: 96 mmol/L — AB (ref 101–111)
CO2: 26 mmol/L (ref 22–32)
Calcium: 8.6 mg/dL — ABNORMAL LOW (ref 8.9–10.3)
Creatinine, Ser: 0.31 mg/dL — ABNORMAL LOW (ref 0.61–1.24)
GFR calc Af Amer: >60 mL/min (ref >60)
GLUCOSE: 101 mg/dL — AB (ref 65–99)
POTASSIUM: 3.6 mmol/L (ref 3.5–5.1)
Sodium: 132 mmol/L — ABNORMAL LOW (ref 135–145)

## 2015-09-12 LAB — HIV ANTIBODY (ROUTINE TESTING W REFLEX)
HIV SCREEN 4TH GENERATION: NONREACTIVE
HIV SCREEN 4TH GENERATION: NONREACTIVE

## 2015-09-12 LAB — RETICULOCYTES
RBC.: 2.85 MIL/uL — ABNORMAL LOW (ref 4.22–5.81)
RETIC COUNT ABSOLUTE: 62.7 10*3/uL (ref 19.0–186.0)
Retic Ct Pct: 2.2 % (ref 0.4–3.1)

## 2015-09-12 LAB — GLUCOSE, CAPILLARY
GLUCOSE-CAPILLARY: 106 mg/dL — AB (ref 65–99)
Glucose-Capillary: 121 mg/dL — ABNORMAL HIGH (ref 65–99)
Glucose-Capillary: 108 mg/dL — ABNORMAL HIGH (ref 65–99)
Glucose-Capillary: 116 mg/dL — ABNORMAL HIGH (ref 65–99)
Glucose-Capillary: 118 mg/dL — ABNORMAL HIGH (ref 65–99)

## 2015-09-12 MED ORDER — MORPHINE SULFATE 15 MG PO TABS
15.0000 mg | ORAL_TABLET | ORAL | Status: DC | PRN
  Administered 2015-09-12 – 2015-09-16 (×15): 15 mg via ORAL
  Filled 2015-09-12 (×16): qty 1

## 2015-09-12 NOTE — Care Management Important Message (Signed)
Important Message  Patient Details  Name: Dean Jones MRN: [id number] Date of Birth: [birth year]   Medicare Important Message Given:  Yes    [name] 09/12/2015, 9:01 AM
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
## 2015-09-13 DIAGNOSIS — G894 Chronic pain syndrome: Secondary | ICD-10-CM

## 2015-09-13 LAB — BASIC METABOLIC PANEL
Anion gap: 11 (ref 5–15)
CALCIUM: 8.6 mg/dL — AB (ref 8.9–10.3)
CO2: 28 mmol/L (ref 22–32)
CREATININE: 0.33 mg/dL — AB (ref 0.61–1.24)
Chloride: 92 mmol/L — ABNORMAL LOW (ref 101–111)
GFR calc Af Amer: >60 mL/min (ref >60)
GLUCOSE: 92 mg/dL (ref 65–99)
Potassium: 3.2 mmol/L — ABNORMAL LOW (ref 3.5–5.1)
SODIUM: 131 mmol/L — AB (ref 135–145)

## 2015-09-13 LAB — GLUCOSE, CAPILLARY
GLUCOSE-CAPILLARY: 112 mg/dL — AB (ref 65–99)
GLUCOSE-CAPILLARY: 121 mg/dL — AB (ref 65–99)
Glucose-Capillary: 118 mg/dL — ABNORMAL HIGH (ref 65–99)
Glucose-Capillary: 150 mg/dL — ABNORMAL HIGH (ref 65–99)

## 2015-09-13 LAB — CBC WITH DIFFERENTIAL/PLATELET
BASOS PCT: 0 %
Basophils Absolute: 0 10*3/uL (ref 0.0–0.1)
EOS ABS: 0.6 10*3/uL (ref 0.0–0.7)
EOS PCT: 7 %
HCT: 26.9 % — ABNORMAL LOW (ref 39.0–52.0)
HEMOGLOBIN: 8.6 g/dL — AB (ref 13.0–17.0)
Lymphocytes Relative: 12 %
Lymphs Abs: 1 10*3/uL (ref 0.7–4.0)
MCH: 28 pg (ref 26.0–34.0)
MCHC: 32 g/dL (ref 30.0–36.0)
MCV: 87.6 fL (ref 78.0–100.0)
MONOS PCT: 11 %
Monocytes Absolute: 1 10*3/uL (ref 0.1–1.0)
NEUTROS PCT: 70 %
Neutro Abs: 6.2 10*3/uL (ref 1.7–7.7)
PLATELETS: 453 10*3/uL — AB (ref 150–400)
RBC: 3.07 MIL/uL — ABNORMAL LOW (ref 4.22–5.81)
RDW: 14.7 % (ref 11.5–15.5)
WBC: 8.8 10*3/uL (ref 4.0–10.5)

## 2015-09-13 LAB — PROCALCITONIN: PROCALCITONIN: 0.84 ng/mL

## 2015-09-13 MED ORDER — POTASSIUM CHLORIDE 20 MEQ/15ML (10%) PO SOLN
40.0000 meq | Freq: Once | ORAL | Status: AC
  Administered 2015-09-13: 40 meq via ORAL
  Filled 2015-09-13: qty 30

## 2015-09-13 NOTE — Progress Notes (Signed)
Pharmacy Antibiotic Note  Dean Jones is a 50 y.o. male admitted on 09/10/2015 with tachypnea, tachycardia, hypertensive during transport from [facility] to another facility. Pt has hx of TBI, is bedbound and has a chronic trach.   Day #4 of abx for sepsis / Pseudomonas PNA. Afebrile, WBC down to wnl. Vancomycin stopped yesterday. SCr low but stable at 0.33.  Plan: Continue Zosyn 3.375 gm IV q8h (4 hour infusion) Monitor clinical picture, renal function F/U C&S, abx deescalation / LOT   Height: 5\' 7"  (170.2 cm) Weight: 114 lb (51.71 kg) IBW/kg (Calculated) : 66.1  Temp (24hrs), Avg:98.4 F (36.9 C), Min:97.6 F (36.4 C), Max:99.1 F (37.3 C)   Recent Labs Lab 09/10/15 1725 09/10/15 1920 09/10/15 2057 09/11/15 0341 09/12/15 0537 09/13/15 0300  WBC 23.3*  --   --  12.7* 8.9 8.8  CREATININE 0.33*  --   --  <0.30* 0.31* 0.33*  LATICACIDVEN  --  2.13* 0.72  --   --   --     Estimated Creatinine Clearance: 79 mL/min (by C-G formula based on Cr of 0.33).    Allergies  Allergen Reactions  . Codeine Shortness Of Breath, Swelling and Rash  . Haloperidol Other (See Comments)    MENTAL STATUS CHANGES/AGITATION    Antimicrobials this admission: 5/31 vancomycin >> 5/31 zosyn >>  Dose adjustments this admission: NA  Microbiology results: 5/31 blood cx: ngtd 5/31 Trach > Pseudomonas (pending) 6/1 Trach > Abundant pseudomonas (pending) MRSA PCR negative Thank you for allowing pharmacy to be a part of this patient's care.  Reginia Naas 09/13/2015 10:13 AM
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
## 2015-09-14 LAB — GLUCOSE, CAPILLARY
Glucose-Capillary: 118 mg/dL — ABNORMAL HIGH (ref 65–99)
Glucose-Capillary: 118 mg/dL — ABNORMAL HIGH (ref 65–99)
Glucose-Capillary: 119 mg/dL — ABNORMAL HIGH (ref 65–99)
Glucose-Capillary: 87 mg/dL (ref 65–99)

## 2015-09-14 LAB — CULTURE, RESPIRATORY

## 2015-09-14 LAB — CULTURE, RESPIRATORY W GRAM STAIN

## 2015-09-14 MED ORDER — IPRATROPIUM-ALBUTEROL 0.5-2.5 (3) MG/3ML IN SOLN
3.0000 mL | Freq: Three times a day (TID) | RESPIRATORY_TRACT | Status: DC
  Administered 2015-09-14 – 2015-09-15 (×6): 3 mL via RESPIRATORY_TRACT
  Filled 2015-09-14 (×5): qty 3

## 2015-09-14 MED ORDER — PNEUMOCOCCAL VAC POLYVALENT 25 MCG/0.5ML IJ INJ
0.5000 mL | INJECTION | INTRAMUSCULAR | Status: AC
  Administered 2015-09-15: 0.5 mL via INTRAMUSCULAR
  Filled 2015-09-14: qty 0.5

## 2015-09-14 MED ORDER — DEXTROSE 5 % IV SOLN
2.0000 g | Freq: Three times a day (TID) | INTRAVENOUS | Status: DC
  Administered 2015-09-14 – 2015-09-16 (×6): 2 g via INTRAVENOUS
  Filled 2015-09-14 (×7): qty 2

## 2015-09-14 MED ORDER — ARTIFICIAL TEARS OP OINT
TOPICAL_OINTMENT | Freq: Three times a day (TID) | OPHTHALMIC | Status: DC
  Administered 2015-09-14 – 2015-09-16 (×6): via OPHTHALMIC
  Filled 2015-09-14: qty 3.5

## 2015-09-14 MED ORDER — VITAMIN B-1 100 MG PO TABS
200.0000 mg | ORAL_TABLET | Freq: Every day | ORAL | Status: DC
  Administered 2015-09-15 – 2015-09-16 (×2): 200 mg via ORAL
  Filled 2015-09-14 (×2): qty 2

## 2015-09-14 MED ORDER — WHITE PETROLATUM GEL
Status: AC
  Administered 2015-09-14: 0.2
  Filled 2015-09-14: qty 1
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
## 2015-09-14 NOTE — Progress Notes (Signed)
Pharmacy Antibiotic Note  Dean Jones is a 50 y.o. male admitted on 09/10/2015 with tachypnea, tachycardia, hypertensive during transport from [facility] to another facility. Pt has hx of TBI, is bedbound and has a chronic trach.   Day #5 of abx for sepsis / Pseudomonas PNA. Afebrile, WBC down to wnl. Pharmacy to de-escalate to ceftazidime  Plan: Stop Zosyn Start ceftazidime 2g IV Q8 Monitor clinical picture, renal function F/U LOT    Height: 5\' 7"  (170.2 cm) Weight: 112 lb (50.803 kg) IBW/kg (Calculated) : 66.1  Temp (24hrs), Avg:98.2 F (36.8 C), Min:97.3 F (36.3 C), Max:98.8 F (37.1 C)   Recent Labs Lab 09/10/15 1725 09/10/15 1920 09/10/15 2057 09/11/15 0341 09/12/15 0537 09/13/15 0300  WBC 23.3*  --   --  12.7* 8.9 8.8  CREATININE 0.33*  --   --  <0.30* 0.31* 0.33*  LATICACIDVEN  --  2.13* 0.72  --   --   --     Estimated Creatinine Clearance: 77.6 mL/min (by C-G formula based on Cr of 0.33).    Allergies  Allergen Reactions  . Codeine Shortness Of Breath, Swelling and Rash  . Haloperidol Other (See Comments)    MENTAL STATUS CHANGES/AGITATION    Antimicrobials this admission: 5/31 vancomycin >> 6/2 5/31 zosyn >> 6/4 6/4 ceftazidime >>  Dose adjustments this admission: NA  Microbiology results: 5/31 blood cx: ngtd 5/31 Trach > Pseudomonas (pending) 6/1 Trach > Abundant pseudomonas (pending) MRSA PCR negative Thank you for allowing pharmacy to be a part of this patient's care.  Reginia Naas 09/14/2015 3:03 PM

## 2015-09-15 DIAGNOSIS — J151 Pneumonia due to Pseudomonas: Secondary | ICD-10-CM | POA: Diagnosis not present

## 2015-09-15 LAB — GLUCOSE, CAPILLARY
GLUCOSE-CAPILLARY: 122 mg/dL — AB (ref 65–99)
GLUCOSE-CAPILLARY: 128 mg/dL — AB (ref 65–99)
GLUCOSE-CAPILLARY: 104 mg/dL — AB (ref 65–99)
GLUCOSE-CAPILLARY: 100 mg/dL — AB (ref 65–99)
Glucose-Capillary: 120 mg/dL — ABNORMAL HIGH (ref 65–99)

## 2015-09-15 LAB — CULTURE, BLOOD (ROUTINE X 2)
CULTURE: NO GROWTH
Culture: NO GROWTH

## 2015-09-15 LAB — CULTURE, RESPIRATORY

## 2015-09-15 LAB — BASIC METABOLIC PANEL
ANION GAP: 7 (ref 5–15)
BUN: 11 mg/dL (ref 6–20)
CO2: 31 mmol/L (ref 22–32)
Calcium: 8.6 mg/dL — ABNORMAL LOW (ref 8.9–10.3)
Chloride: 96 mmol/L — ABNORMAL LOW (ref 101–111)
Creatinine, Ser: 0.31 mg/dL — ABNORMAL LOW (ref 0.61–1.24)
GFR calc Af Amer: >60 mL/min (ref >60)
GLUCOSE: 86 mg/dL (ref 65–99)
POTASSIUM: 3.8 mmol/L (ref 3.5–5.1)
SODIUM: 134 mmol/L — AB (ref 135–145)

## 2015-09-15 LAB — CBC
HEMATOCRIT: 27.6 % — AB (ref 39.0–52.0)
HEMOGLOBIN: 8.4 g/dL — AB (ref 13.0–17.0)
MCH: 27.5 pg (ref 26.0–34.0)
MCHC: 30.4 g/dL (ref 30.0–36.0)
MCV: 90.5 fL (ref 78.0–100.0)
Platelets: 438 10*3/uL — ABNORMAL HIGH (ref 150–400)
RBC: 3.05 MIL/uL — ABNORMAL LOW (ref 4.22–5.81)
RDW: 14.5 % (ref 11.5–15.5)
WBC: 8.2 10*3/uL (ref 4.0–10.5)

## 2015-09-15 LAB — CULTURE, RESPIRATORY W GRAM STAIN

## 2015-09-15 MED ORDER — IPRATROPIUM-ALBUTEROL 0.5-2.5 (3) MG/3ML IN SOLN
3.0000 mL | Freq: Four times a day (QID) | RESPIRATORY_TRACT | Status: DC
  Administered 2015-09-16 (×3): 3 mL via RESPIRATORY_TRACT
  Filled 2015-09-15 (×3): qty 3

## 2015-09-15 NOTE — Care Management Important Message (Signed)
Important Message  Patient Details  Name: Dean Jones MRN: [id number] Date of Birth: 11-06-1965   Medicare Important Message Given:  Yes    Nathen May 09/15/2015, 11:27 AM
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
## 2015-09-15 NOTE — Progress Notes (Signed)
Pt can DC tomorrow to [facility] in [place], [place].  Transport with Community education officer at [employer] informed and agreeable  MD planning to order IV antibiotics early so it will be done in time for transport  CSW will continue to follow  Domenica Reamer, [place] Social Worker [phone]

## 2015-09-16 LAB — CBC
HCT: 31 % — ABNORMAL LOW (ref 39.0–52.0)
Hemoglobin: 9.4 g/dL — ABNORMAL LOW (ref 13.0–17.0)
MCH: 27.9 pg (ref 26.0–34.0)
MCHC: 30.3 g/dL (ref 30.0–36.0)
MCV: 92 fL (ref 78.0–100.0)
PLATELETS: 519 10*3/uL — AB (ref 150–400)
RBC: 3.37 MIL/uL — AB (ref 4.22–5.81)
RDW: 14.5 % (ref 11.5–15.5)
WBC: 12.3 10*3/uL — AB (ref 4.0–10.5)

## 2015-09-16 LAB — BASIC METABOLIC PANEL
Anion gap: 10 (ref 5–15)
BUN: 15 mg/dL (ref 6–20)
CO2: 31 mmol/L (ref 22–32)
Calcium: 9 mg/dL (ref 8.9–10.3)
Chloride: 94 mmol/L — ABNORMAL LOW (ref 101–111)
Creatinine, Ser: <0.3 mg/dL — ABNORMAL LOW (ref 0.61–1.24)
Glucose, Bld: 109 mg/dL — ABNORMAL HIGH (ref 65–99)
POTASSIUM: 3.6 mmol/L (ref 3.5–5.1)
SODIUM: 135 mmol/L (ref 135–145)

## 2015-09-16 LAB — GLUCOSE, CAPILLARY
GLUCOSE-CAPILLARY: 109 mg/dL — AB (ref 65–99)
GLUCOSE-CAPILLARY: 122 mg/dL — AB (ref 65–99)
GLUCOSE-CAPILLARY: 157 mg/dL — AB (ref 65–99)
GLUCOSE-CAPILLARY: 66 mg/dL (ref 65–99)
Glucose-Capillary: 131 mg/dL — ABNORMAL HIGH (ref 65–99)

## 2015-09-16 LAB — HEMOGLOBIN A1C
Hgb A1c MFr Bld: 5.5 % (ref 4.8–5.6)
MEAN PLASMA GLUCOSE: 111 mg/dL

## 2015-09-16 MED ORDER — DEXTROSE 5 % IV SOLN
2.0000 g | Freq: Three times a day (TID) | INTRAVENOUS | Status: DC
  Administered 2015-09-16: 2 g via INTRAVENOUS
  Filled 2015-09-16 (×2): qty 2

## 2015-09-16 MED ORDER — SACCHAROMYCES BOULARDII 250 MG PO CAPS
250.0000 mg | ORAL_CAPSULE | Freq: Two times a day (BID) | ORAL | Status: DC
  Administered 2015-09-16: 250 mg via ORAL
  Filled 2015-09-16: qty 1

## 2015-09-16 MED ORDER — DEXTROSE 5 % IV SOLN: 2.0000 g | Freq: Three times a day (TID) | INTRAVENOUS | Status: AC

## 2015-09-16 MED ORDER — PIVOT 1.5 CAL PO LIQD: 1000.0000 mL | ORAL | Status: AC

## 2015-09-16 NOTE — Progress Notes (Signed)
Pt being transported by [facility]. Pt's belongings returned. Pt stable at time of transport.

## 2015-09-16 NOTE — Progress Notes (Signed)
Pt has some expiratory wheezes. Respiratory rate of 28-30 bpm. RT notified to administer some breathing treatment. Will continue to monitor.

## 2015-09-16 NOTE — Progress Notes (Signed)
DC sent to facility- confirmed they are ready for pt today  Patient will discharge to [facility] Anticipated discharge date:6/6 Family notified: pt wife Transportation by [facility]- scheduled for 1pm  CSW signing off.  Domenica Reamer, [place] Social Worker [phone]

## 2017-06-10 DEATH — deceased

## 2018-02-10 IMAGING — CR DG CHEST 1V PORT
1 series · 1 of 1 positions shown · non-contrast
Comparison: None.

CLINICAL DATA: History of traumatic brain injury. Low oxygen
saturations. Tracheostomy tube.

EXAM:
PORTABLE CHEST 1 VIEW

[AP]
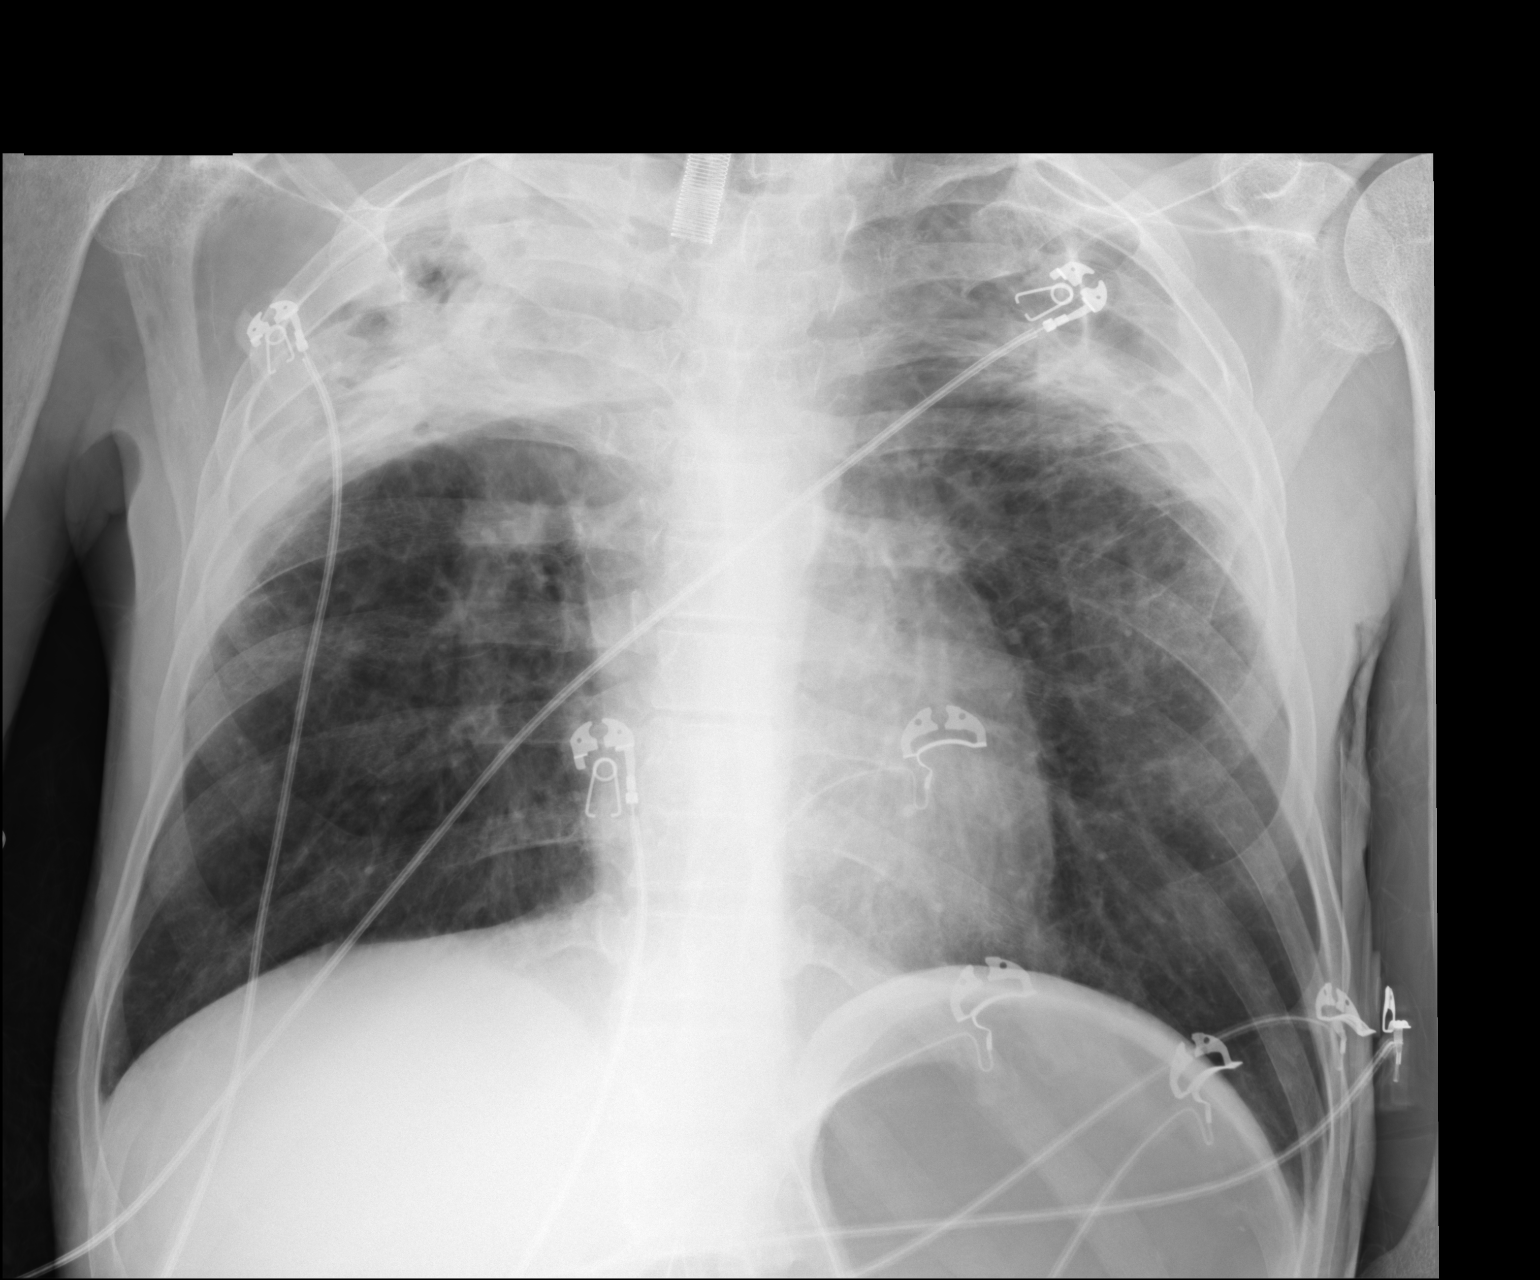

[1 of 1 positions shown; findings below may reference images not displayed]

FINDINGS: There is a tracheostomy tube and the tip is well above the carina.
Opacities in both upper lungs could represent a combination of
pleural and parenchymal disease. Disease is more confluent or dense
on the right side. Lung bases are clear. Evidence for gas in the
stomach. Heart size is within normal limits.
IMPRESSION: Prominent densities involving the apices and upper lungs
bilaterally, right side greater than left. This probably represents
a combination of pleural and parenchymal disease and may be chronic.
Neoplastic process cannot be excluded but this could be better
characterized with comparison images if available.

Tracheostomy tube.
